# Patient Record
Sex: Male | Born: 1985
Health system: Southern US, Community
[De-identification: ages and names within clinical notes are randomized; demographics above are authoritative.]

## PROBLEM LIST (undated history)

## (undated) DIAGNOSIS — T7840XA Allergy, unspecified, initial encounter: Secondary | ICD-10-CM

## (undated) DIAGNOSIS — T7808XA Anaphylactic reaction due to eggs, initial encounter: Secondary | ICD-10-CM

## (undated) DIAGNOSIS — J45909 Unspecified asthma, uncomplicated: Secondary | ICD-10-CM

## (undated) DIAGNOSIS — L301 Dyshidrosis [pompholyx]: Secondary | ICD-10-CM

## (undated) HISTORY — PX: MOLE REMOVAL: SHX2046

## (undated) HISTORY — DX: Allergy, unspecified, initial encounter: T78.40XA

## (undated) HISTORY — DX: Dyshidrosis (pompholyx): L30.1

## (undated) HISTORY — PX: WISDOM TOOTH EXTRACTION: SHX21

## (undated) HISTORY — DX: Unspecified asthma, uncomplicated: J45.909

## (undated) HISTORY — DX: Anaphylactic reaction due to eggs, initial encounter: T78.08XA

---

## 2014-03-02 ENCOUNTER — Encounter: Payer: Self-pay | Admitting: Family Medicine

## 2014-03-02 ENCOUNTER — Ambulatory Visit (INDEPENDENT_AMBULATORY_CARE_PROVIDER_SITE_OTHER): Payer: BLUE CROSS/BLUE SHIELD | Admitting: Family Medicine

## 2014-03-02 VITALS — BP 128/85 | HR 78 | Ht 72.0 in | Wt 205.0 lb

## 2014-03-02 DIAGNOSIS — Z0000 Encounter for general adult medical examination without abnormal findings: Secondary | ICD-10-CM

## 2014-03-02 NOTE — Patient Instructions (Signed)
Dr. Kynzlee Hucker's General Advice Following Your Complete Physical Exam  The Benefits of Regular Exercise: Unless you suffer from an uncontrolled cardiovascular condition, studies strongly suggest that regular exercise and physical activity will add to both the quality and length of your life.  The World Health Organization recommends 150 minutes of moderate intensity aerobic activity every week.  This is best split over 3-4 days a week, and can be as simple as a brisk walk for just over 35 minutes "most days of the week".  This type of exercise has been shown to lower LDL-Cholesterol, lower average blood sugars, lower blood pressure, lower cardiovascular disease risk, improve memory, and increase one's overall sense of wellbeing.  The addition of anaerobic (or "strength training") exercises offers additional benefits including but not limited to increased metabolism, prevention of osteoporosis, and improved overall cholesterol levels.  How Can I Strive For A Low-Fat Diet?: Current guidelines recommend that 25-35 percent of your daily energy (food) intake should come from fats.  One might ask how can this be achieved without having to dissect each meal on a daily basis?  Switch to skim or 1% milk instead of whole milk.  Focus on lean meats such as ground turkey, fresh fish, baked chicken, and lean cuts of beef as your source of dietary protein.  Limit saturated fat consumption to less than 10% of your daily caloric intake.  Limit trans fatty acid consumption primarily by limiting synthetic trans fats such as partially hydrogenated oils (Ex: fried fast foods).  Substitute olive or vegetable oil for solid fats where possible.  Moderation of Salt Intake: Provided you don't carry a diagnosis of congestive heart failure nor renal failure, I recommend a daily allowance of no more than 2300 mg of salt (sodium).  Keeping under this daily goal is associated with a decreased risk of cardiovascular events, creeping  above it can lead to elevated blood pressures and increases your risk of cardiovascular events.  Milligrams (mg) of salt is listed on all nutrition labels, and your daily intake can add up faster than you think.  Most canned and frozen dinners can pack in over half your daily salt allowance in one meal.    Lifestyle Health Risks: Certain lifestyle choices carry specific health risks.  As you may already know, tobacco use has been associated with increasing one's risk of cardiovascular disease, pulmonary disease, numerous cancers, among many other issues.  What you may not know is that there are medications and nicotine replacement strategies that can more than double your chances of successfully quitting.  I would be thrilled to help manage your quitting strategy if you currently use tobacco products.  When it comes to alcohol use, I've yet to find an "ideal" daily allowance.  Provided an individual does not have a medical condition that is exacerbated by alcohol consumption, general guidelines determine "safe drinking" as no more than two standard drinks for a man or no more than one standard drink for a male per day.  However, much debate still exists on whether any amount of alcohol consumption is technically "safe".  My general advice, keep alcohol consumption to a minimum for general health promotion.  If you or others believe that alcohol, tobacco, or recreational drug use is interfering with your life, I would be happy to provide confidential counseling regarding treatment options.  General "Over The Counter" Nutrition Advice: Postmenopausal women should aim for a daily calcium intake of 1200 mg, however a significant portion of this might already be   provided by diets including milk, yogurt, cheese, and other dairy products.  Vitamin D has been shown to help preserve bone density, prevent fatigue, and has even been shown to help reduce falls in the elderly.  Ensuring a daily intake of 800 Units of  Vitamin D is a good place to start to enjoy the above benefits, we can easily check your Vitamin D level to see if you'd potentially benefit from supplementation beyond 800 Units a day.  Folic Acid intake should be of particular concern to women of childbearing age.  Daily consumption of 400-800 mcg of Folic Acid is recommended to minimize the chance of spinal cord defects in a fetus should pregnancy occur.    For many adults, accidents still remain one of the most common culprits when it comes to cause of death.  Some of the simplest but most effective preventitive habits you can adopt include regular seatbelt use, proper helmet use, securing firearms, and regularly testing your smoke and carbon monoxide detectors.  Ricardo Koltz B. Ayde Record DO [facility] [address], [address] [place], [place] [address] Phone: [phone]  

## 2014-03-02 NOTE — Progress Notes (Signed)
CC: Ricardo Young is a 29 y.o. 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 He sees an allergist about once a year, Dr. Vista [facility]   Colonoscopy: No family history of colon cancer will begin screening at age 37 Prostate: Discussed screening risks/beneifts with patient today, will consider screening at age 13.   Influenza Vaccine: allergic to this Pneumovax:  Td/Tdap: He believes he's had this in the last 10 years, requesting outside records. Zoster: (Start 29 yo)  Rare alcohol use no tobacco or recreational drug use  Review of Systems - General ROS: negative for - chills, fever, night sweats, weight gain or weight loss Ophthalmic ROS: negative for - decreased vision Psychological ROS: negative for - anxiety or depression ENT ROS: negative for - hearing change, nasal congestion, tinnitus or allergies Hematological and Lymphatic ROS: negative for - bleeding problems, bruising or swollen lymph nodes Breast ROS: negative Respiratory ROS: no cough, shortness of breath, or wheezing Cardiovascular ROS: no chest pain or dyspnea on exertion Gastrointestinal ROS: no abdominal pain, change in bowel habits, or black or bloody stools Genito-Urinary ROS: negative for - genital discharge, genital ulcers, incontinence or abnormal bleeding from genitals Musculoskeletal ROS: negative for - joint pain or muscle pain Neurological ROS: negative for - headaches or memory loss Dermatological ROS: negative for lumps, mole changes, rash and skin lesion changes    Past Medical History  Diagnosis Date  . Allergy     Past Surgical History  Procedure Laterality Date  . Wisdom tooth extraction    . Mole removal     Family History  Problem Relation Age of Onset  . Stroke Maternal  Grandmother   . Stroke Maternal Grandfather   . Diabetes Maternal Grandmother   . Diabetes Maternal Grandfather     History   Social History  . Marital Status: Married    Spouse Name: N/A    Number of Children: N/A  . Years of Education: N/A   Occupational History  . Not on file.   Social History Main Topics  . Smoking status: Never Smoker   . Smokeless tobacco: Not on file  . Alcohol Use: No  . Drug Use: No  . Sexual Activity:    Partners: Female   Other Topics Concern  . Not on file   Social History Narrative     Objective: BP 128/85 mmHg  Pulse 78  Ht 6' (1.829 m)  Wt 205 lb (92.987 kg)  BMI 27.80 kg/m2  General: No Acute Distress HEENT: Atraumatic, normocephalic, conjunctivae normal without scleral icterus.  No nasal discharge, hearing grossly intact, TMs with good landmarks bilaterally with no middle ear abnormalities, posterior pharynx clear without oral lesions. Neck: Supple, trachea midline, no cervical nor supraclavicular adenopathy. Pulmonary: Clear to auscultation bilaterally without wheezing, rhonchi, nor rales. Cardiac: Regular rate and rhythm.  No murmurs, rubs, nor gallops. No peripheral edema.  2+ peripheral pulses bilaterally. Abdomen: Bowel sounds normal.  No masses.  Non-tender without rebound.  Negative Murphy's sign. MSK: Grossly intact, no signs of weakness.  Full strength throughout upper and lower extremities.  Full ROM in upper and lower extremities.  No midline spinal tenderness. Neuro: Gait unremarkable, CN II-XII grossly intact.  C5-C6 Reflex 2/4 Bilaterally, L4 Reflex 2/4 Bilaterally.  Cerebellar function intact. Skin: No rashes.  Psych: Alert and oriented to person/place/time.  Thought process normal. No anxiety/depression.  Assessment & Plan: Lorin PicketScott was seen today for establish care.  Diagnoses and associated orders for this visit:  Annual physical exam - Lipid panel - COMPLETE METABOLIC PANEL WITH GFR    Healthy lifestyle  interventions including but not limited to regular exercise, a healthy low fat diet, moderation of salt intake, the dangers of tobacco/alcohol/recreational drug use, nutrition supplementation, and accident avoidance were discussed with the patient and a handout was provided for future reference.  Forms for [employer] completed.  Return if symptoms worsen or fail to improve.

## 2014-03-26 ENCOUNTER — Encounter: Payer: Self-pay | Admitting: Family Medicine

## 2014-03-26 DIAGNOSIS — T7840XA Allergy, unspecified, initial encounter: Secondary | ICD-10-CM | POA: Insufficient documentation

## 2014-03-26 DIAGNOSIS — J45909 Unspecified asthma, uncomplicated: Secondary | ICD-10-CM | POA: Insufficient documentation

## 2014-03-26 HISTORY — DX: Unspecified asthma, uncomplicated: J45.909

## 2014-04-28 ENCOUNTER — Encounter: Payer: Self-pay | Admitting: Family Medicine

## 2014-05-22 LAB — COMPLETE METABOLIC PANEL WITH GFR
ALK PHOS: 66 U/L (ref 39–117)
ALT: 41 U/L (ref 0–53)
AST: 21 U/L (ref 0–37)
Albumin: 4.4 g/dL (ref 3.5–5.2)
BUN: 14 mg/dL (ref 6–23)
CO2: 27 mEq/L (ref 19–32)
Calcium: 9.3 mg/dL (ref 8.4–10.5)
Chloride: 101 mEq/L (ref 96–112)
Creat: 1.01 mg/dL (ref 0.50–1.35)
GFR, Est African American: >89 mL/min
GLUCOSE: 84 mg/dL (ref 70–99)
Potassium: 4.5 mEq/L (ref 3.5–5.3)
Sodium: 140 mEq/L (ref 135–145)
Total Bilirubin: 0.7 mg/dL (ref 0.2–1.2)
Total Protein: 7.2 g/dL (ref 6.0–8.3)

## 2014-05-22 LAB — LIPID PANEL
CHOLESTEROL: 173 mg/dL (ref 0–200)
HDL: 30 mg/dL — ABNORMAL LOW (ref >=40)
LDL CALC: 96 mg/dL (ref 0–99)
Total CHOL/HDL Ratio: 5.8 Ratio
Triglycerides: 234 mg/dL — ABNORMAL HIGH (ref <150)
VLDL: 47 mg/dL — AB (ref 0–40)

## 2014-05-25 ENCOUNTER — Telehealth: Payer: Self-pay | Admitting: Family Medicine

## 2014-05-25 ENCOUNTER — Encounter: Payer: Self-pay | Admitting: Family Medicine

## 2014-05-25 DIAGNOSIS — E781 Pure hyperglyceridemia: Secondary | ICD-10-CM | POA: Insufficient documentation

## 2014-05-25 MED ORDER — FISH OIL 1000 MG PO CAPS: ORAL_CAPSULE | ORAL | Status: DC

## 2014-05-25 NOTE — Telephone Encounter (Signed)
Ricardo Young, Will you please let patient know that his blood sugar, kidney function, and liver function were all normal.  His cholesterol is normal however triglyerides are elevated to a degree that over time can lead to pancreatic or liver inflammation. I'd recommend he start a 1g OTC fish oil supplement twice a day with meals and return for a recheck in three months.

## 2014-05-25 NOTE — Telephone Encounter (Signed)
Pt.notified

## 2014-10-26 ENCOUNTER — Encounter: Payer: Self-pay | Admitting: Family Medicine

## 2014-10-26 ENCOUNTER — Ambulatory Visit (INDEPENDENT_AMBULATORY_CARE_PROVIDER_SITE_OTHER): Payer: BLUE CROSS/BLUE SHIELD | Admitting: Family Medicine

## 2014-10-26 VITALS — BP 131/88 | HR 78 | Wt 194.0 lb

## 2014-10-26 DIAGNOSIS — L0291 Cutaneous abscess, unspecified: Secondary | ICD-10-CM | POA: Diagnosis not present

## 2014-10-26 MED ORDER — CLINDAMYCIN HCL 300 MG PO CAPS: 300.0000 mg | ORAL_CAPSULE | Freq: Three times a day (TID) | ORAL | Status: DC

## 2014-10-26 NOTE — Progress Notes (Signed)
CC: Ricardo Young is a 29 y.o. male is here for blister on heel and Joint Swelling   Subjective: HPI:  On Saturday he started to form a blister on the back of the left heel. The blister has slowly been enlarging and has become more and more painful. On Sunday he also noticed that the inside of his lower medial ankle is becoming swollen and painful. Pain is worse when he takes his first few steps after inactivity and then slowly improves. Currently mild in severity. Denies any other trauma to the left ankle. Interventions have included Epsom salts soaking, elevation of the ankle, and ice. These interventions only been mildly beneficial. He states he feels well other than the pain. Denies fevers, chills, shortness of breath, nor joint pain elsewhere.   Review Of Systems Outlined In HPI  Past Medical History  Diagnosis Date  . Allergy     Past Surgical History  Procedure Laterality Date  . Wisdom tooth extraction    . Mole removal     Family History  Problem Relation Age of Onset  . Stroke Maternal Grandmother   . Stroke Maternal Grandfather   . Diabetes Maternal Grandmother   . Diabetes Maternal Grandfather     Social History   Social History  . Marital Status: Married    Spouse Name: N/A  . Number of Children: N/A  . Years of Education: N/A   Occupational History  . Not on file.   Social History Main Topics  . Smoking status: Never Smoker   . Smokeless tobacco: Not on file  . Alcohol Use: No  . Drug Use: No  . Sexual Activity:    Partners: Female   Other Topics Concern  . Not on file   Social History Narrative     Objective: BP 131/88 mmHg  Pulse 78  Wt 194 lb (87.998 kg)  Vital signs reviewed. General: Alert and Oriented, No Acute Distress HEENT: Pupils equal, round, reactive to light. Conjunctivae clear.  External ears unremarkable.  Moist mucous membranes. Lungs: Clear and comfortable work of breathing, speaking in full sentences without accessory muscle  use. Cardiac: Regular rate and rhythm.  Neuro: CN II-XII grossly intact, gait normal. Extremities: No peripheral edema.  Strong peripheral pulses. Slight pain at the inferior aspect of the left medial malleolus.  Mental Status: No depression, anxiety, nor agitation. Logical though process. Skin: Warm and dry. On the back of the left ankle there is a 2 cm diameter blister filled with purulent material. There is some mild erythema surrounding this with moderate swelling underneath the medial malleolus.  Assessment & Plan: Ricardo Young was seen today for blister on heel and joint swelling.  Diagnoses and all orders for this visit:  Abscess -     clindamycin (CLEOCIN) 300 MG capsule; Take 1 capsule (300 mg total) by mouth 3 (three) times daily. -     Wound culture   His blister has now turned into an abscess. He will start on clindamycin and continue with Epsom salts soaks twice a day. I sent some of the abscess fluid culture, I've asked him to call me if he is not feeling or looking better by Wednesday.Signs and symptoms requring emergent/urgent reevaluation were discussed with the patient.  Return if symptoms worsen or fail to improve.   Incision and Drainage Procedure Note  Pre-operative Diagnosis: abscess  Post-operative Diagnosis: same  Indications: pain and infection  Anesthesia: not needed  Procedure Details  The procedure, risks and complications have been  discussed in detail (including, but not limited to airway compromise, infection, bleeding) with the patient, and the patient has signed consent to the procedure.  The skin was sterilely prepped and draped over the affected area in the usual fashion. After adequate local anesthesia, I&D with a #11 and 10 blade was performed on the left back of ankle. Purulent drainage: present The patient was observed until stable.  Findings: Successful I7D  EBL: 0 cc's  Drains: none  Condition: Tolerated procedure  well   Complications: none.

## 2014-10-30 ENCOUNTER — Ambulatory Visit (INDEPENDENT_AMBULATORY_CARE_PROVIDER_SITE_OTHER): Payer: BLUE CROSS/BLUE SHIELD | Admitting: Family Medicine

## 2014-10-30 ENCOUNTER — Ambulatory Visit (INDEPENDENT_AMBULATORY_CARE_PROVIDER_SITE_OTHER): Payer: BLUE CROSS/BLUE SHIELD

## 2014-10-30 ENCOUNTER — Encounter: Payer: Self-pay | Admitting: Family Medicine

## 2014-10-30 VITALS — BP 144/96 | HR 82 | Ht 72.0 in | Wt 192.0 lb

## 2014-10-30 DIAGNOSIS — X58XXXA Exposure to other specified factors, initial encounter: Secondary | ICD-10-CM | POA: Diagnosis not present

## 2014-10-30 DIAGNOSIS — S92411A Displaced fracture of proximal phalanx of right great toe, initial encounter for closed fracture: Secondary | ICD-10-CM

## 2014-10-30 DIAGNOSIS — S92911A Unspecified fracture of right toe(s), initial encounter for closed fracture: Secondary | ICD-10-CM | POA: Diagnosis not present

## 2014-10-30 DIAGNOSIS — S9031XA Contusion of right foot, initial encounter: Secondary | ICD-10-CM

## 2014-10-30 LAB — WOUND CULTURE: Gram Stain: NONE SEEN

## 2014-10-30 NOTE — Assessment & Plan Note (Signed)
Plan to treat with postop shoe and limited weightbearing. Return in one to 2 weeks. NSAIDs for pain control.

## 2014-10-30 NOTE — Patient Instructions (Signed)
Thank you for coming in today. You broke your toe.  Use the post op shoe until you come back.  Return in 1 week or follow up with workers comp doctor.  Take ibuprofen for pain as needed.   Toe Fracture Your caregiver has diagnosed you as having a fractured toe. A toe fracture is a break in the bone of a toe. "Buddy taping" is a way of splinting your broken toe, by taping the broken toe to the toe next to it. This "buddy taping" will keep the injured toe from moving beyond normal range of motion. Buddy taping also helps the toe heal in a more normal alignment. It may take 6 to 8 weeks for the toe injury to heal. HOME CARE INSTRUCTIONS   Leave your toes taped together for as long as directed by your caregiver or until you see a doctor for a follow-up examination. You can change the tape after bathing. Always use a small piece of gauze or cotton between the toes when taping them together. This will help the skin stay dry and prevent infection.  Apply ice to the injury for 15-20 minutes each hour while awake for the first 2 days. Put the ice in a plastic bag and place a towel between the bag of ice and your skin.  After the first 2 days, apply heat to the injured area. Use heat for the next 2 to 3 days. Place a heating pad on the foot or soak the foot in warm water as directed by your caregiver.  Keep your foot elevated as much as possible to lessen swelling.  Wear sturdy, supportive shoes. The shoes should not pinch the toes or fit tightly against the toes.  Your caregiver may prescribe a rigid shoe if your foot is very swollen.  Your may be given crutches if the pain is too great and it hurts too much to walk.  Only take over-the-counter or prescription medicines for pain, discomfort, or fever as directed by your caregiver.  If your caregiver has given you a follow-up appointment, it is very important to keep that appointment. Not keeping the appointment could result in a chronic or permanent  injury, pain, and disability. If there is any problem keeping the appointment, you must call back to this facility for assistance. SEEK MEDICAL CARE IF:   You have increased pain or swelling, not relieved with medications.  The pain does not get better after 1 week.  Your injured toe is cold when the others are warm. SEEK IMMEDIATE MEDICAL CARE IF:   The toe becomes cold, numb, or white.  The toe becomes hot (inflamed) and red. Document Released: 01/28/2000 Document Revised: 04/24/2011 Document Reviewed: 09/16/2007 [facility] Patient Information 2015 [place], [place]. This information is not intended to replace advice given to you by your health care provider. Make sure you discuss any questions you have with your health care provider.

## 2014-10-30 NOTE — Progress Notes (Signed)
Ricardo Young is a 29 y.o. male who presents to [facility]  today for foot injury. Patient was at work yesterday when he dropped a heavy weight onto the dorsal aspect of his right first MTP. He notes pain and swelling. He's been using a crutch and taking ibuprofen which helps some. He has not had file Worker's Compensation claim. Heis able to move his toe and denies any weakness or numbness distally. No fevers chills nausea vomiting or diarrhea.   Past Medical History  Diagnosis Date  . Allergy    Past Surgical History  Procedure Laterality Date  . Wisdom tooth extraction    . Mole removal     Social History  Substance Use Topics  . Smoking status: Never Smoker   . Smokeless tobacco: Not on file  . Alcohol Use: No   family history includes Diabetes in his maternal grandfather and maternal grandmother; Stroke in his maternal grandfather and maternal grandmother.  ROS as above Medications: Current Outpatient Prescriptions  Medication Sig Dispense Refill  . clindamycin (CLEOCIN) 300 MG capsule Take 1 capsule (300 mg total) by mouth 3 (three) times daily. 30 capsule 0  . diphenhydrAMINE (BENADRYL) 25 mg capsule Take 25 mg by mouth every 6 (six) hours as needed.    . Omega-3 Fatty Acids (FISH OIL) 1000 MG CAPS One PO BID with meals.  0   No current facility-administered medications for this visit.   Allergies  Allergen Reactions  . Augmentin [Amoxicillin-Pot Clavulanate] Anaphylaxis  . Eggs Or Egg-Derived Products Anaphylaxis  . Influenza Vaccines Anaphylaxis  . Amoxicillin      Exam:  BP 144/96 mmHg  Pulse 82  Ht 6' (1.829 m)  Wt 192 lb (87.091 kg)  BMI 26.03 kg/m2 Gen: Well NAD HEENT: EOMI,  MMM Lungs: Normal work of breathing. CTABL Heart: RRR no MRG Abd: NABS, Soft. Nondistended, Nontender Exts: Brisk capillary refill, warm and well perfused.  Right foot: Swollen ecchymosis and tender just proximal to the first MTP of the right  foot.   No results found for this or any previous visit (from the past 24 hour(s)). Dg Foot Complete Right  10/30/2014   CLINICAL DATA:  Pain, swelling and bruising to anterior right foot over second through fourth metatarsals after dropping heavy weight on it last night.  EXAM: RIGHT FOOT COMPLETE - 3+ VIEW  COMPARISON:  None.  FINDINGS: There is a comminuted intraarticular fracture in the distal aspect of the right great toe proximal phalanx. Minimal displacement of fracture fragments. Joint spaces are maintained.  IMPRESSION: Comminuted, intra-articular fracture through the distal aspect of the right great toe proximal phalanx.   Electronically Signed   By: Charlett Nose M.D.   On: 10/30/2014 09:36     Please see individual assessment and plan sections.

## 2015-04-14 ENCOUNTER — Encounter: Payer: Self-pay | Admitting: Family Medicine

## 2015-04-14 DIAGNOSIS — L309 Dermatitis, unspecified: Secondary | ICD-10-CM | POA: Insufficient documentation

## 2015-04-15 ENCOUNTER — Encounter: Payer: Self-pay | Admitting: Family Medicine

## 2015-06-09 ENCOUNTER — Ambulatory Visit (INDEPENDENT_AMBULATORY_CARE_PROVIDER_SITE_OTHER): Payer: BLUE CROSS/BLUE SHIELD | Admitting: Family Medicine

## 2015-06-09 ENCOUNTER — Encounter: Payer: Self-pay | Admitting: Family Medicine

## 2015-06-09 VITALS — BP 134/93 | HR 76 | Wt 206.0 lb

## 2015-06-09 DIAGNOSIS — T7808XA Anaphylactic reaction due to eggs, initial encounter: Secondary | ICD-10-CM

## 2015-06-09 MED ORDER — EPINEPHRINE 0.15 MG/0.15ML IJ SOAJ: 0.1500 mg | INTRAMUSCULAR | Status: DC | PRN

## 2015-06-09 NOTE — Progress Notes (Signed)
CC: Spero GeraldsScott Mauceri is a 30 y.o. male is here for Allergies   Subjective: HPI:  History of anaphylaxis to egg products reports a history of anaphylaxis to egg products, it required hospitalizations twice. He would like to know if it would be wise for him to carry a epinephrine injector. He's been pretty good over the last few years of avoiding eggs products. He tells me he has some sniffles due to environmental allergens but overall has been feeling good lately. He'll be traveling out of the country later this year and thinks this might require a epinephrine injector. Currently he denies any flushing, shortness of breath, difficulty swallowing or difficulty breathing.   Review Of Systems Outlined In HPI  Past Medical History  Diagnosis Date  . Allergy     Past Surgical History  Procedure Laterality Date  . Wisdom tooth extraction    . Mole removal     Family History  Problem Relation Age of Onset  . Stroke Maternal Grandmother   . Stroke Maternal Grandfather   . Diabetes Maternal Grandmother   . Diabetes Maternal Grandfather     Social History   Social History  . Marital Status: Married    Spouse Name: N/A  . Number of Children: N/A  . Years of Education: N/A   Occupational History  . Not on file.   Social History Main Topics  . Smoking status: Never Smoker   . Smokeless tobacco: Not on file  . Alcohol Use: No  . Drug Use: No  . Sexual Activity:    Partners: Female   Other Topics Concern  . Not on file   Social History Narrative     Objective: BP 134/93 mmHg  Pulse 76  Wt 206 lb (93.441 kg)  Vital signs reviewed. General: Alert and Oriented, No Acute Distress HEENT: Pupils equal, round, reactive to light. Conjunctivae clear.  External ears unremarkable.  Moist mucous membranes. Lungs: Clear and comfortable work of breathing, speaking in full sentences without accessory muscle use. Cardiac: Regular rate and rhythm.  Neuro: CN II-XII grossly intact, gait  normal. Extremities: No peripheral edema.  Strong peripheral pulses.  Mental Status: No depression, anxiety, nor agitation. Logical though process. Skin: Warm and dry. Assessment & Plan: Lorin PicketScott was seen today for allergies.  Diagnoses and all orders for this visit:  Anaphylaxis due to eggs, initial encounter -     EPINEPHrine 0.15 MG/0.15ML IJ injection; Inject 0.15 mLs (0.15 mg total) into the muscle as needed for anaphylaxis.   Discussed that it would be very reasonable for him to keep a epinephrine injector on hand whether his here in the states or internationally traveling. We discussed how to use this device and what situation it would need to be used in.  Return if symptoms worsen or fail to improve.

## 2016-03-17 DIAGNOSIS — J111 Influenza due to unidentified influenza virus with other respiratory manifestations: Secondary | ICD-10-CM | POA: Diagnosis not present

## 2016-05-28 ENCOUNTER — Emergency Department (INDEPENDENT_AMBULATORY_CARE_PROVIDER_SITE_OTHER)
Admission: EM | Admit: 2016-05-28 | Discharge: 2016-05-28 | Disposition: A | Discharge status: Home / Self Care | Payer: BLUE CROSS/BLUE SHIELD | Source: Home / Self Care | Attending: Family Medicine | Admitting: Family Medicine

## 2016-05-28 ENCOUNTER — Encounter: Payer: Self-pay | Admitting: Emergency Medicine

## 2016-05-28 DIAGNOSIS — J069 Acute upper respiratory infection, unspecified: Secondary | ICD-10-CM | POA: Diagnosis not present

## 2016-05-28 DIAGNOSIS — B9789 Other viral agents as the cause of diseases classified elsewhere: Secondary | ICD-10-CM

## 2016-05-28 LAB — POCT INFLUENZA A/B
INFLUENZA B, POC: NEGATIVE
Influenza A, POC: NEGATIVE

## 2016-05-28 MED ORDER — PREDNISONE 20 MG PO TABS: ORAL_TABLET | ORAL | 0 refills | Status: DC

## 2016-05-28 MED ORDER — AZITHROMYCIN 250 MG PO TABS: ORAL_TABLET | ORAL | 0 refills | Status: DC

## 2016-05-28 NOTE — ED Triage Notes (Signed)
Patient has seasonal allergies but 2 days ago he experienced increased congestion and need to clear his throat; had low grade fever of 100. No OTCs today. Is not taking his sinus allergy med.

## 2016-05-28 NOTE — Discharge Instructions (Signed)
Take plain guaifenesin (1200mg extended release tabs such as Mucinex) twice daily, with plenty of water, for cough and congestion.  May add Pseudoephedrine (30mg, one or two every 4 to 6 hours) for sinus congestion.  Get adequate rest.   °May use Afrin nasal spray (or generic oxymetazoline) each morning for about 5 days and then discontinue.  Also recommend using saline nasal spray several times daily and saline nasal irrigation (AYR is a common brand).   °Try warm salt water gargles for sore throat.  °Stop all antihistamines for now, and other non-prescription cough/cold preparations. °May take Delsym Cough Suppressant at bedtime for nighttime cough.  °Begin Azithromycin if not improving about one week or if persistent fever develops   °Follow-up with family doctor if not improving about10 days.  °

## 2016-05-28 NOTE — ED Provider Notes (Signed)
[facility]    CSN: [id number] Arrival date & time: 05/28/16  1319     History   Chief Complaint Chief Complaint  Patient presents with  . Nasal Congestion  . Cough    HPI Ricardo Young is a 31 y.o. male.   Complains of 2 day history flu-like illness including myalgias, headache, fever (to 100.2)/chills, fatigue, and cough.  Also has mild nasal congestion and sore throat.  Cough is non-productive and somewhat worse at night.  No pleuritic pain or shortness of breath but he had some wheezing this morning.  He had influenza A three months ago.   The history is provided by the patient.    Past Medical History:  Diagnosis Date  . Allergy     Patient Active Problem List   Diagnosis Date Noted  . Eczema 04/14/2015  . Toe fracture, right 10/30/2014  . Hypertriglyceridemia 05/25/2014  . Allergic reaction 03/26/2014  . Asthma, chronic 03/26/2014    Past Surgical History:  Procedure Laterality Date  . MOLE REMOVAL    . WISDOM TOOTH EXTRACTION         Home Medications    Prior to Admission medications   Medication Sig Start Date End Date Taking? Authorizing Provider  azithromycin (ZITHROMAX Z-PAK) 250 MG tablet Take 2 tabs today; then begin one tab once daily for 4 more days. (Rx void after 06/05/16) 05/28/16   Lattie Haw, MD  diphenhydrAMINE (BENADRYL) 25 mg capsule Take 25 mg by mouth every 6 (six) hours as needed.    Historical Provider, MD  EPINEPHrine 0.15 MG/0.15ML IJ injection Inject 0.15 mLs (0.15 mg total) into the muscle as needed for anaphylaxis. 06/09/15   Laren Boom, DO  Omega-3 Fatty Acids (FISH OIL) 1000 MG CAPS One PO BID with meals. 05/25/14   Sean Hommel, DO  predniSONE (DELTASONE) 20 MG tablet Take one tab by mouth twice daily for 4 days, then one daily. Take with food. 05/28/16   Lattie Haw, MD    Family History Family History  Problem Relation Age of Onset  . Stroke Maternal Grandmother   . Diabetes Maternal Grandmother   .  Stroke Maternal Grandfather   . Diabetes Maternal Grandfather     Social History Social History  Substance Use Topics  . Smoking status: Never Smoker  . Smokeless tobacco: Never Used  . Alcohol use No     Allergies   Augmentin [amoxicillin-pot clavulanate]; Eggs or egg-derived products; Influenza vaccines; and Amoxicillin   Review of Systems Review of Systems + sore throat + cough No pleuritic pain + wheezing + nasal congestion + post-nasal drainage No sinus pain/pressure No itchy/red eyes No earache No hemoptysis No SOB + fever, + chills No nausea No vomiting No abdominal pain No diarrhea No urinary symptoms No skin rash + fatigue + myalgias + headache Used OTC meds without relief   Physical Exam Triage Vital Signs ED Triage Vitals  Enc Vitals Group     BP 05/28/16 1406 113/76     Pulse Rate 05/28/16 1406 92     Resp 05/28/16 1406 17     Temp 05/28/16 1406 98.5 F (36.9 C)     Temp Source 05/28/16 1406 Oral     SpO2 05/28/16 1406 98 %     Weight 05/28/16 1407 190 lb (86.2 kg)     Height 05/28/16 1407 6' (1.829 m)     Head Circumference --      Peak Flow --  Pain Score 05/28/16 1407 0     Pain Loc --      Pain Edu? --      Excl. in GC? --    No data found.   Updated Vital Signs BP 113/76 (BP Location: Left Arm)   Pulse 92   Temp 98.5 F (36.9 C) (Oral)   Resp 17   Ht 6' (1.829 m)   Wt 190 lb (86.2 kg)   SpO2 98%   BMI 25.77 kg/m   Visual Acuity Right Eye Distance:   Left Eye Distance:   Bilateral Distance:    Right Eye Near:   Left Eye Near:    Bilateral Near:     Physical Exam Nursing notes and Vital Signs reviewed. Appearance:  Patient appears stated age, and in no acute distress Eyes:  Pupils are equal, round, and reactive to light and accomodation.  Extraocular movement is intact.  Conjunctivae are not inflamed  Ears:  Canals normal.  Tympanic membranes normal.  Nose:  Mildly congested turbinates.  No sinus tenderness.      Pharynx:  Normal Neck:  Supple.  Tender enlarged posterior/lateral nodes are palpated bilaterally  Lungs:  Clear to auscultation.  Breath sounds are equal.  Moving air well. Heart:  Regular rate and rhythm without murmurs, rubs, or gallops.  Abdomen:  Nontender without masses or hepatosplenomegaly.  Bowel sounds are present.  No CVA or flank tenderness.  Extremities:  No edema.  Skin:  No rash present.    UC Treatments / Results  Labs (all labs ordered are listed, but only abnormal results are displayed) Labs Reviewed  POCT INFLUENZA A/B negative    EKG  EKG Interpretation None       Radiology No results found.  Procedures Procedures (including critical care time)  Medications Ordered in UC Medications - No data to display   Initial Impression / Assessment and Plan / UC Course  I have reviewed the triage vital signs and the nursing notes.  Pertinent labs & imaging results that were available during my care of the patient were reviewed by me and considered in my medical decision making (see chart for details).    There is no evidence of bacterial infection today.  With patient's history of seasonal rhinitis, and past history of asthma as a child, he will benefit from a course of prednisone.  Begin prednisone burst/taper. Take plain guaifenesin (  extended release tabs such as Mucinex) twice daily, with plenty of water, for cough and congestion.  May add Pseudoephedrine ( , one or two every 4 to 6 hours) for sinus congestion.  Get adequate rest.   May use Afrin nasal spray (or generic oxymetazoline) each morning for about 5 days and then discontinue.  Also recommend using saline nasal spray several times daily and saline nasal irrigation (AYR is a common brand).   Try warm salt water gargles for sore throat.  Stop all antihistamines for now, and other non-prescription cough/cold preparations. May take Delsym Cough Suppressant at bedtime for nighttime cough.    Begin Azithromycin if not improving about one week or if persistent fever develops (Given a prescription to hold, with an expiration date)  Follow-up with family doctor if not improving about10 days.     Final Clinical Impressions(s) / UC Diagnoses   Final diagnoses:  Viral URI with cough    New Prescriptions Discharge Medication List as of 05/28/2016  3:06 PM    START taking these medications   Details  azithromycin (ZITHROMAX Z-PAK)  250 MG tablet Take 2 tabs today; then begin one tab once daily for 4 more days. (Rx void after 06/05/16), Print    predniSONE (DELTASONE) 20 MG tablet Take one tab by mouth twice daily for 4 days, then one daily. Take with food., Normal         Lattie Haw, MD 06/01/16 [phone]

## 2016-11-10 ENCOUNTER — Ambulatory Visit (INDEPENDENT_AMBULATORY_CARE_PROVIDER_SITE_OTHER): Payer: BLUE CROSS/BLUE SHIELD | Admitting: Physician Assistant

## 2016-11-10 VITALS — BP 135/87 | HR 77 | Temp 98.1°F | Wt 195.0 lb

## 2016-11-10 DIAGNOSIS — L301 Dyshidrosis [pompholyx]: Secondary | ICD-10-CM

## 2016-11-10 MED ORDER — CLOBETASOL PROPIONATE 0.05 % EX OINT: 1.0000 "application " | TOPICAL_OINTMENT | Freq: Two times a day (BID) | CUTANEOUS | 0 refills | Status: DC

## 2016-11-10 MED ORDER — HYDROXYZINE HCL 25 MG PO TABS: 25.0000 mg | ORAL_TABLET | Freq: Three times a day (TID) | ORAL | 0 refills | Status: DC | PRN

## 2016-11-10 MED ORDER — MUPIROCIN 2 % EX OINT: TOPICAL_OINTMENT | CUTANEOUS | 0 refills | Status: DC

## 2016-11-10 NOTE — Progress Notes (Signed)
HPI:                                                                Ricardo Young is a 31 y.o. male who presents to [facility]: Primary Care [facility] Medicine today for rash  Patient with PMH of eczema, asthma and allergies presents today with worsening hand rash. Problem is chronic. States rash is confined to his fingers and is worse on his left 3rd digit, which is slightly tender. Reports itching and redness. Has been using topical Triamcinolone cream without much relief. Denies fever, drainage or swelling.   Past Medical History:  Diagnosis Date  . Allergy    Past Surgical History:  Procedure Laterality Date  . MOLE REMOVAL    . WISDOM TOOTH EXTRACTION     Social History  Substance Use Topics  . Smoking status: Never Smoker  . Smokeless tobacco: Never Used  . Alcohol use No   family history includes Diabetes in his maternal grandfather and maternal grandmother; Stroke in his maternal grandfather and maternal grandmother.  ROS: negative except as noted in the HPI  Medications: Current Outpatient Prescriptions  Medication Sig Dispense Refill  . azithromycin (ZITHROMAX Z-PAK) 250 MG tablet Take 2 tabs today; then begin one tab once daily for 4 more days. (Rx void after 06/05/16) 6 tablet 0  . diphenhydrAMINE (BENADRYL) 25 mg capsule Take 25 mg by mouth every 6 (six) hours as needed.    Marland Kitchen EPINEPHrine 0.15 MG/0.15ML IJ injection Inject 0.15 mLs (0.15 mg total) into the muscle as needed for anaphylaxis. 2 Device 0  . Omega-3 Fatty Acids (FISH OIL) 1000 MG CAPS One PO BID with meals.  0  . predniSONE (DELTASONE) 20 MG tablet Take one tab by mouth twice daily for 4 days, then one daily. Take with food. 12 tablet 0   No current facility-administered medications for this visit.    Allergies  Allergen Reactions  . Augmentin [Amoxicillin-Pot Clavulanate] Anaphylaxis  . Eggs Or Egg-Derived Products Anaphylaxis  . Influenza Vaccines Anaphylaxis  . Amoxicillin         Objective:  BP 135/87   Pulse 77   Temp 98.1 F (36.7 C) (Oral)   Wt 195 lb (88.5 kg)   BMI 26.45 kg/m  Gen:  alert, not ill-appearing, no distress, appropriate for age HEENT: head normocephalic without obvious abnormality, conjunctiva and cornea clear, trachea midline Pulm: Normal work of breathing, normal phonation, clear to auscultation bilaterally, no wheezes, rales or rhonchi CV: Normal rate, regular rhythm, s1 and s2 distinct, no murmurs, clicks or rubs  Neuro: alert and oriented x 3, no tremor MSK: extremities atraumatic, normal gait and station Skin: warm, dry, intact; dorsal aspect of bilateral digits with scaly, erythematous rash, left third distal phalanx slightly tender and red, no warmth or swelling     No results found for this or any previous visit (from the past 72 hour(s)). No results found.    Assessment and Plan: 31 y.o. male with   1. Dyshidrotic eczema - no evidence of paronychia. Super high potency topical corticosteroids to affected areas for 2 weeks. Treat secondary infection on left distal phalanx with Bactroban. - clobetasol ointment (TEMOVATE) 0.05 %; Apply 1 application topically 2 (two) times daily.  Dispense: 15 g;  Refill: 0 - mupirocin ointment (BACTROBAN) 2 %; Apply to affected area TID for 7 days.  Dispense: 30 g; Refill: 0 - hydrOXYzine (ATARAX/VISTARIL) 25 MG tablet; Take 1 tablet (25 mg total) by mouth 3 (three) times daily as needed for itching. For itching.  Dispense: 30 tablet; Refill: 0   Patient education and anticipatory guidance given Patient agrees with treatment plan Follow-up as needed if symptoms worsen or fail to improve  Levonne Hubert PA-C

## 2016-11-12 IMAGING — CR DG FOOT COMPLETE 3+V*R*
3 series · 3 of 3 positions shown · non-contrast
Comparison: None.

CLINICAL DATA: Pain, swelling and bruising to anterior right foot
over second through fourth metatarsals after dropping heavy weight
on it last night.

EXAM:
RIGHT FOOT COMPLETE - 3+ VIEW

[foot ap]
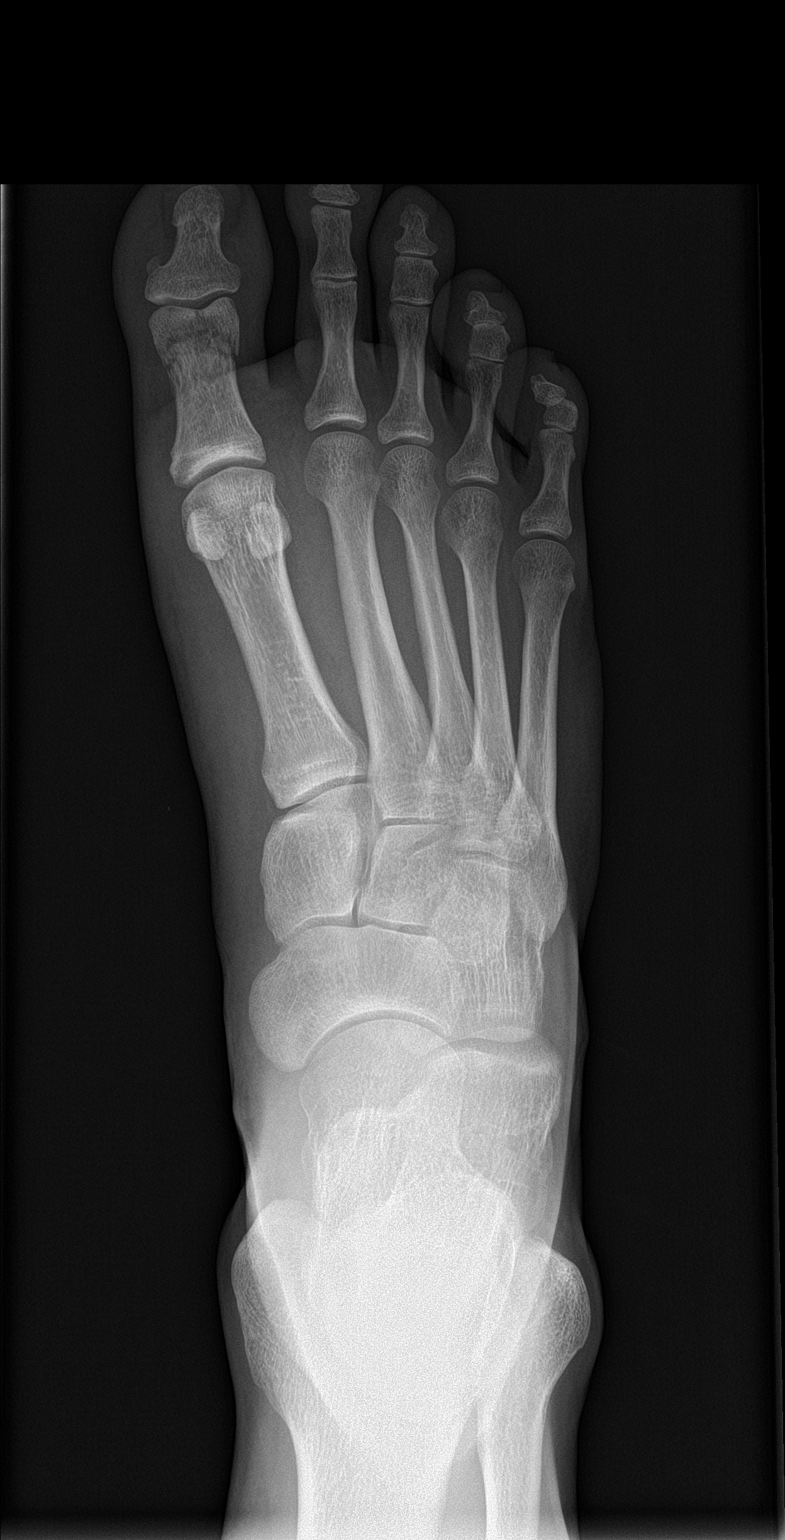

[foot obl]
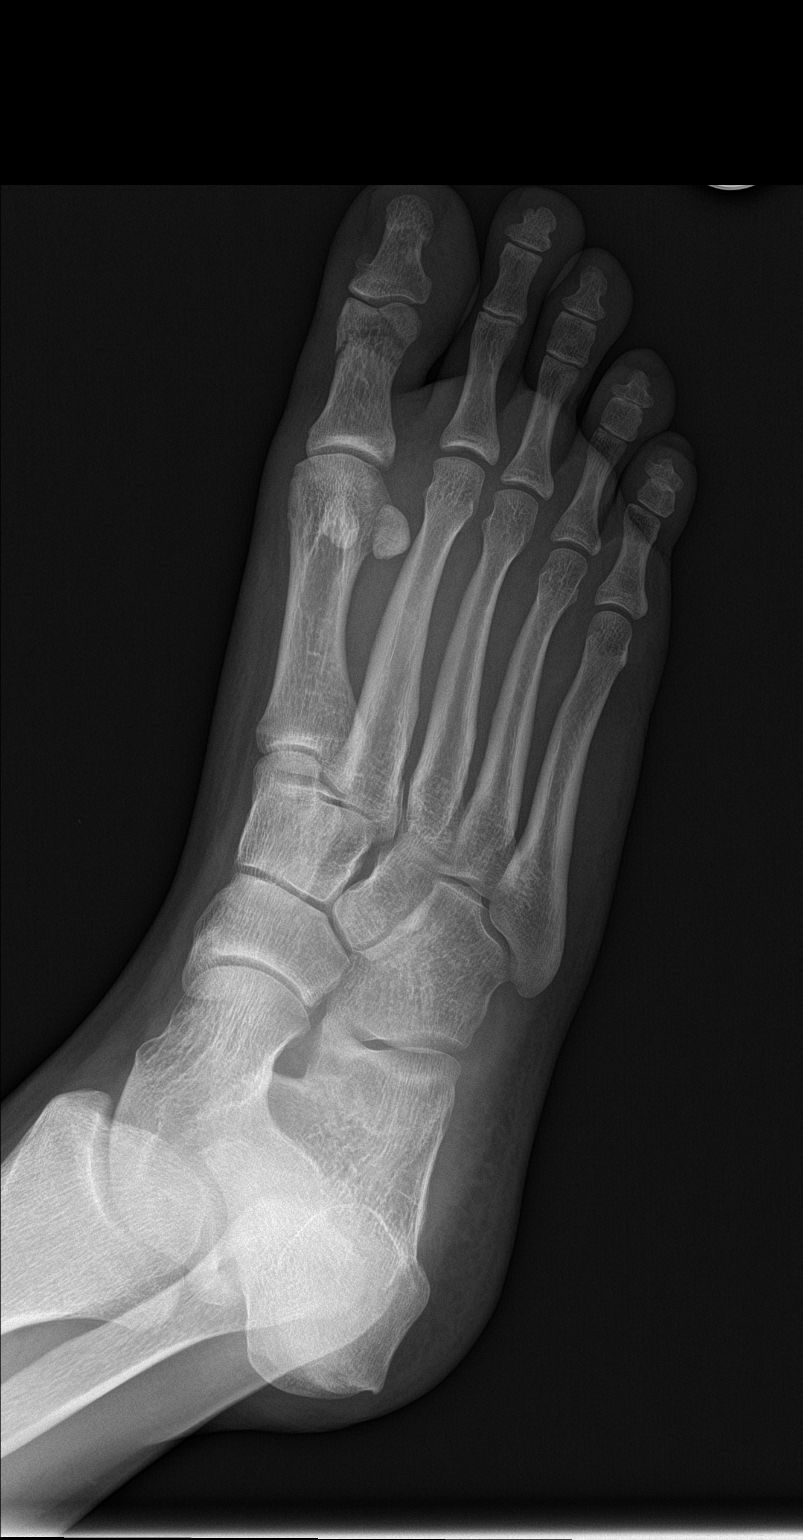

[foot lat]
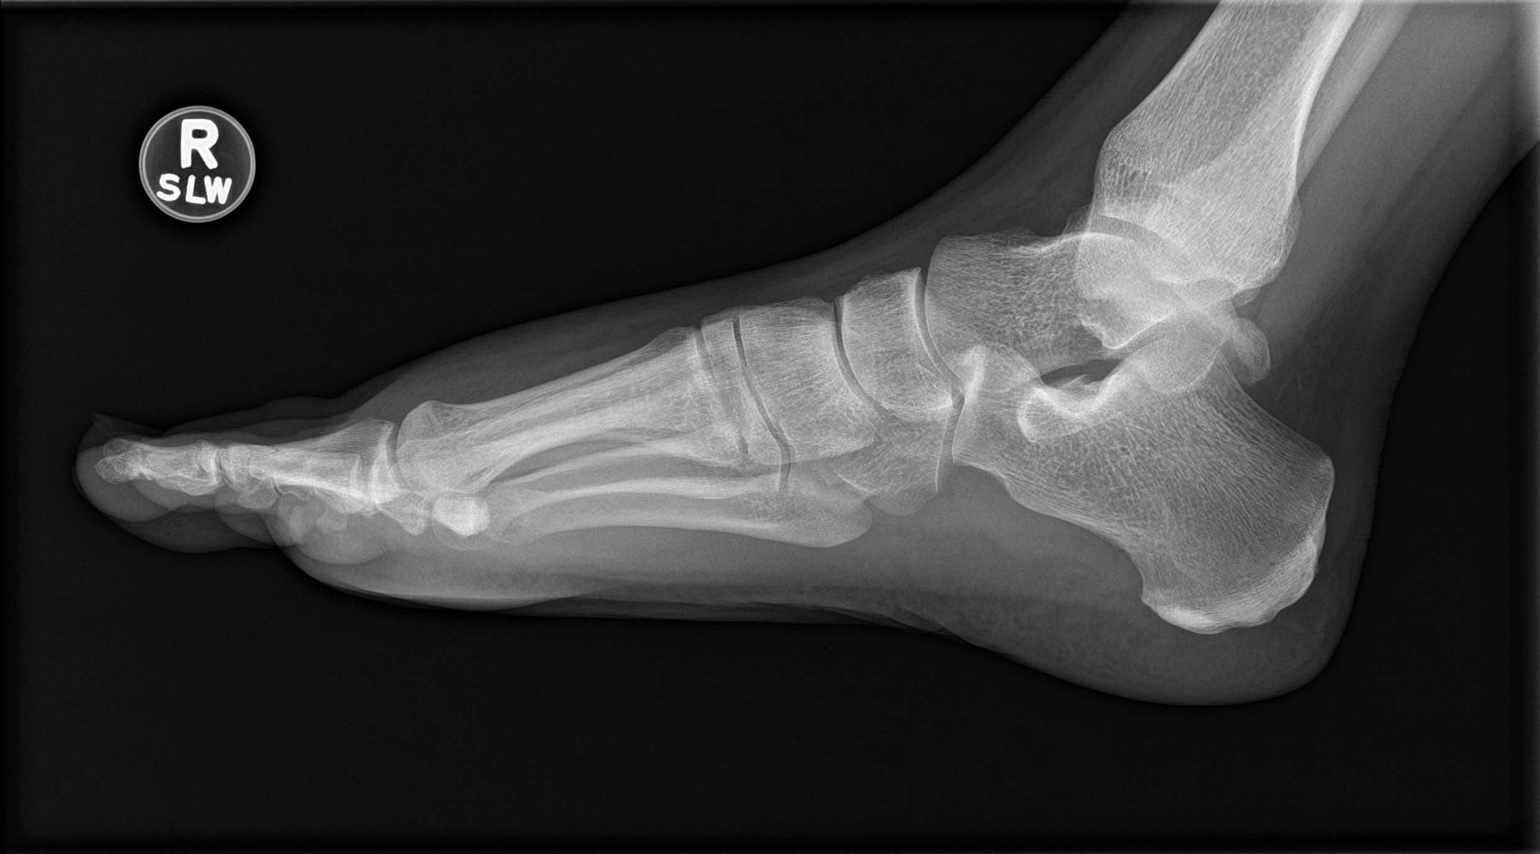

[3 of 3 positions shown; findings below may reference images not displayed]

FINDINGS: There is a comminuted intraarticular fracture in the distal aspect
of the right great toe proximal phalanx. Minimal displacement of
fracture fragments. Joint spaces are maintained.
IMPRESSION: Comminuted, intra-articular fracture through the distal aspect of
the right great toe proximal phalanx.

## 2016-11-13 ENCOUNTER — Encounter: Payer: Self-pay | Admitting: Physician Assistant

## 2016-11-13 DIAGNOSIS — L301 Dyshidrosis [pompholyx]: Secondary | ICD-10-CM

## 2016-11-13 HISTORY — DX: Dyshidrosis (pompholyx): L30.1

## 2016-11-20 DIAGNOSIS — L309 Dermatitis, unspecified: Secondary | ICD-10-CM | POA: Diagnosis not present

## 2017-02-19 ENCOUNTER — Encounter: Payer: Self-pay | Admitting: Physician Assistant

## 2017-02-19 ENCOUNTER — Ambulatory Visit (INDEPENDENT_AMBULATORY_CARE_PROVIDER_SITE_OTHER): Payer: BLUE CROSS/BLUE SHIELD | Admitting: Physician Assistant

## 2017-02-19 VITALS — BP 142/95 | HR 75 | Ht 72.0 in | Wt 194.7 lb

## 2017-02-19 DIAGNOSIS — Z823 Family history of stroke: Secondary | ICD-10-CM | POA: Insufficient documentation

## 2017-02-19 DIAGNOSIS — T7808XA Anaphylactic reaction due to eggs, initial encounter: Secondary | ICD-10-CM | POA: Insufficient documentation

## 2017-02-19 DIAGNOSIS — N069 Isolated proteinuria with unspecified morphologic lesion: Secondary | ICD-10-CM | POA: Diagnosis not present

## 2017-02-19 DIAGNOSIS — R800 Isolated proteinuria: Secondary | ICD-10-CM

## 2017-02-19 DIAGNOSIS — Z13220 Encounter for screening for lipoid disorders: Secondary | ICD-10-CM | POA: Diagnosis not present

## 2017-02-19 DIAGNOSIS — I10 Essential (primary) hypertension: Secondary | ICD-10-CM

## 2017-02-19 DIAGNOSIS — Z136 Encounter for screening for cardiovascular disorders: Secondary | ICD-10-CM

## 2017-02-19 HISTORY — DX: Anaphylactic reaction due to eggs, initial encounter: T78.08XA

## 2017-02-19 NOTE — Patient Instructions (Addendum)
For your blood pressure: - BP goal <= 130/80 - Check blood pressure at home - Limit salt to <2000 mg/day - Follow DASH eating plan - limit alcohol to 2 standard drinks per day - avoid tobacco products - weight loss: 7% of current body weight   Proteinuria Proteinuria is when there is too much protein in the urine. Proteins are important for buildingmuscles and bones. Proteins are also needed to fight infections, help the blood clot, and keep body fluids in balance. Proteinuria may be mild and temporary, or it may be an early sign of kidney disease. The kidneys make urine. Healthy kidneys also keep substances like proteins from leaving the blood and ending up in the urine. Proteinuria may be a sign that the kidneys are not working well. What are the causes? Healthy kidneys have filters (glomeruli) that keep proteins out of the urine. Proteinuria may mean that the glomeruli are damaged. The main causes of this type of damage are:  Diabetes.  High blood pressure.  Other causes of kidney damage can also cause proteinuria, such as:  Diseases of the immune system, such as lupus, rheumatoid arthritis, sarcoidosis, and Goodpasture syndrome.  Heart disease or heart failure.  Kidney infection.  Certain cancers, including kidney cancer, lymphoma, leukemia, and multiple myeloma.  Amyloidosis. This is a disease that causes abnormal proteins to build up in body tissues.  Reactions to certain medicines, such as NSAIDs.  Injury (trauma) or poisons (toxins).  High blood pressure that occurs during pregnancy (preeclampsia).  Temporary proteinuria may result from conditions that put stress on the kidneys. These conditions usually do not cause kidney damage. They include:  Fever.  Exposure to cold or heat.  Emotional or physical stress.  Extreme exercise.  Standing for long periods of time.  What increases the risk? This condition is more likely to develop in people who:  Have  diabetes.  Have high blood pressure.  Have heart disease or heart failure.  Have an immune disease, cancer, or other disease that affects the kidneys.  Have a family history of kidney disease.  Are 65 or older.  Are overweight.  Are of African-American, American [place], Hispanic/Latino, or [place] descent.  Are pregnant.  Have an infection.  What are the signs or symptoms? Mild proteinuria may not cause symptoms. As more proteins enter the urine, symptoms of kidney disease may develop, such as:  Foamy urine.  Swelling of the face, abdomen, hands, legs, or feet (edema).  Needing to urinate frequently.  Fatigue.  Difficulty sleeping.  Dry and itchy skin.  Nausea and vomiting.  Muscle cramps.  Shortness of breath.  How is this diagnosed? Your health care provider can diagnose proteinuria with a urine test. You may have this test as part of a routine physical or because you have symptoms of kidney disease or risk factors for kidney disease. You may also have:  Blood tests to measure the level of a certain substance (creatinine) that increases with kidney disease.  Imaging tests of your kidney, such as a CT scan or an ultrasound, to look for signs of kidney damage.  How is this treated? If your proteinuria is mild or temporary, no treatment may be needed. Your health care provider may show you how to monitor the level of protein in your urine at home. Identifying proteinuria early is important so that the cause of the condition can be treated. Treatment depends on the cause of your proteinuria. Treatment may include:  Making diet and lifestyle  changes.  Getting blood pressure under control.  Getting blood sugar under control, if you have diabetes.  Managing any other medical conditions you have that affect your kidneys.  Giving birth, if you are pregnant.  Avoiding medicines that damage your kidneys.  Treating kidney disease with medicine and  dialysis, as needed.  Follow these instructions at home:  Check your protein levels at home, if directed by your health care provider.  Follow instructions from your health care provider about eating or drinking restrictions.  Take over-the-counter and prescription medicines only as told by your health care provider.  Return to your normal activities as told by your health care provider. Ask your health care provider what activities are safe for you.  If you are overweight, ask your health care provider to help you with a diet to get to a healthy weight.  Ask your health care provider to help you with an exercise program.  Keep all follow-up visits as told by your health care provider. This is important. Contact a health care provider if:  You have new symptoms.  Your symptoms get worse or do not improve. Get help right away if:  You have back pain.  You have diarrhea.  You vomit.  You have a fever.  You have a rash. This information is not intended to replace advice given to you by your health care provider. Make sure you discuss any questions you have with your health care provider. Document Released: 03/22/2005 Document Revised: 03/12/2015 Document Reviewed: 12/21/2014 Elsevier Interactive Patient Education  [employer].

## 2017-02-19 NOTE — Progress Notes (Signed)
HPI:                                                                Ricardo Young is a 32 y.o. male who presents to [facility]: Primary [facility] Medicine today for proteinuria   Reports incidental finding of proteinuria at [employer] physical 1 week ago. Denies urinary symptoms. Reports BP was also elevated 142/94. Reports he has very intermittently checked his BP and reports readings in the 110-120/80's. Family history of HTN in both parents, father stroke age 81 Denies family history of kidney disease  Past Medical History:  Diagnosis Date  . Allergy   . 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 Asthma, chronic 03/26/2014  . Dyshidrotic eczema 11/13/2016   Past Surgical History:  Procedure Laterality Date  . MOLE REMOVAL    . WISDOM TOOTH EXTRACTION     Social History   Tobacco Use  . Smoking status: Never Smoker  . Smokeless tobacco: Never Used  Substance Use Topics  . Alcohol use: No    Alcohol/week: 0.0 oz   family history includes Diabetes in his maternal grandfather and maternal grandmother; Hypertension in his father and mother; Stroke in his father, maternal grandfather, and maternal grandmother.  ROS: Review of Systems  Cardiovascular: Negative.   Genitourinary: Negative.      Medications: Current Outpatient Medications  Medication Sig Dispense Refill  . hydrocortisone 1 % ointment Apply 1 application topically 2 (two) times daily.     No current facility-administered medications for this visit.    Allergies  Allergen Reactions  . Augmentin [Amoxicillin-Pot Clavulanate] Anaphylaxis  . Eggs Or Egg-Derived Products Anaphylaxis  . Influenza Vaccines Anaphylaxis  . Amoxicillin        Objective:  BP (!) 142/95   Pulse 75   Ht 6' (1.829 m)   Wt 194 lb 11.2 oz (88.3 kg)   BMI 26.41 kg/m  Gen:  alert, not ill-appearing, no distress, appropriate for age HEENT: head normocephalic without obvious abnormality, conjunctiva and cornea clear,  trachea midline Pulm: Normal work of breathing, normal phonation, clear to auscultation bilaterally, no wheezes, rales or rhonchi CV: Normal rate, regular rhythm, s1 and s2 distinct, no murmurs, clicks or rubs  Neuro: alert and oriented x 3, no tremor MSK: extremities atraumatic, normal gait and station, no peripheral edema Skin: intact, no rashes on exposed skin, no jaundice, no cyanosis Psych: well-groomed, cooperative, good eye contact, euthymic mood, affect mood-congruent, speech is articulate, and thought processes clear and goal-directed  No results found for this or any previous visit (from the past 72 hour(s)). No results found.    Assessment and Plan: 32 y.o. male with   1. Isolated proteinuria without specific morphologic lesion - plan to monitor BP at home and Q7months in office. Plan to monitor UA Q72months - Urinalysis, Routine w reflex microscopic - COMPLETE METABOLIC PANEL WITH GFR  2. Encounter for lipid screening for cardiovascular disease - Lipid Panel w/reflex Direct LDL  3. Essential hypertension BP Readings from Last 3 Encounters:  02/19/17 (!) 142/95  11/10/16 135/87  05/28/16 113/76  - no additional CVD risk factors - therapeutic lifestyle changes - monitor BP at home and log readings - follow-up in 6 months   Patient education and anticipatory guidance  given Patient agrees with treatment plan Follow-up in 6 months or sooner as needed if symptoms worsen or fail to improve  Levonne Hubertharley E. Cummings PA-C

## 2017-02-21 DIAGNOSIS — Z13220 Encounter for screening for lipoid disorders: Secondary | ICD-10-CM | POA: Diagnosis not present

## 2017-02-21 DIAGNOSIS — Z136 Encounter for screening for cardiovascular disorders: Secondary | ICD-10-CM | POA: Diagnosis not present

## 2017-02-21 DIAGNOSIS — N069 Isolated proteinuria with unspecified morphologic lesion: Secondary | ICD-10-CM | POA: Diagnosis not present

## 2017-02-21 LAB — COMPLETE METABOLIC PANEL WITH GFR
AG Ratio: 1.9 (calc) (ref 1.0–2.5)
ALKALINE PHOSPHATASE (APISO): 67 U/L (ref 40–115)
ALT: 41 U/L (ref 9–46)
AST: 21 U/L (ref 10–40)
Albumin: 4.5 g/dL (ref 3.6–5.1)
BUN: 14 mg/dL (ref 7–25)
CALCIUM: 9.1 mg/dL (ref 8.6–10.3)
CO2: 29 mmol/L (ref 20–32)
CREATININE: 0.95 mg/dL (ref 0.60–1.35)
Chloride: 104 mmol/L (ref 98–110)
GFR, EST NON AFRICAN AMERICAN: 106 mL/min/{1.73_m2} (ref >=60)
GFR, Est African American: 123 mL/min/{1.73_m2} (ref >=60)
GLUCOSE: 95 mg/dL (ref 65–99)
Globulin: 2.4 g/dL (calc) (ref 1.9–3.7)
Potassium: 4.4 mmol/L (ref 3.5–5.3)
SODIUM: 140 mmol/L (ref 135–146)
TOTAL PROTEIN: 6.9 g/dL (ref 6.1–8.1)
Total Bilirubin: 0.6 mg/dL (ref 0.2–1.2)

## 2017-02-21 LAB — LIPID PANEL W/REFLEX DIRECT LDL
CHOL/HDL RATIO: 4.1 (calc) (ref <5.0)
CHOLESTEROL: 179 mg/dL (ref <200)
HDL: 44 mg/dL (ref >40)
LDL CHOLESTEROL (CALC): 118 mg/dL — AB
NON-HDL CHOLESTEROL (CALC): 135 mg/dL — AB (ref <130)
Triglycerides: 75 mg/dL (ref <150)

## 2017-02-23 NOTE — Progress Notes (Signed)
Your labs look good - normal kidney function, liver enzymes - cholesterol in a healthy range - normal blood counts - no evidence of diabetes

## 2018-08-20 DIAGNOSIS — S0501XA Injury of conjunctiva and corneal abrasion without foreign body, right eye, initial encounter: Secondary | ICD-10-CM | POA: Diagnosis not present

## 2018-08-21 DIAGNOSIS — S0501XD Injury of conjunctiva and corneal abrasion without foreign body, right eye, subsequent encounter: Secondary | ICD-10-CM | POA: Diagnosis not present

## 2020-01-03 ENCOUNTER — Ambulatory Visit: Payer: Self-pay

## 2020-01-05 ENCOUNTER — Emergency Department (INDEPENDENT_AMBULATORY_CARE_PROVIDER_SITE_OTHER)
Admission: EM | Admit: 2020-01-05 | Discharge: 2020-01-05 | Disposition: A | Discharge status: Home / Self Care | Payer: PRIVATE HEALTH INSURANCE | Source: Home / Self Care

## 2020-01-05 ENCOUNTER — Other Ambulatory Visit: Payer: Self-pay

## 2020-01-05 DIAGNOSIS — Z760 Encounter for issue of repeat prescription: Secondary | ICD-10-CM

## 2020-01-05 DIAGNOSIS — J069 Acute upper respiratory infection, unspecified: Secondary | ICD-10-CM

## 2020-01-05 MED ORDER — EPINEPHRINE 0.3 MG/0.3ML IJ SOAJ: 0.3000 mg | INTRAMUSCULAR | 0 refills | Status: AC | PRN

## 2020-01-05 MED ORDER — PREDNISONE 20 MG PO TABS: 40.0000 mg | ORAL_TABLET | Freq: Every day | ORAL | 0 refills | Status: DC

## 2020-01-05 MED ORDER — BENZONATATE 100 MG PO CAPS: 100.0000 mg | ORAL_CAPSULE | Freq: Three times a day (TID) | ORAL | 0 refills | Status: DC

## 2020-01-05 MED ORDER — DOXYCYCLINE HYCLATE 100 MG PO CAPS: 100.0000 mg | ORAL_CAPSULE | Freq: Two times a day (BID) | ORAL | 0 refills | Status: AC

## 2020-01-05 NOTE — ED Triage Notes (Signed)
Pt c/o cold sxs since last Sunday. Started as intermittent fever. Wife was sick as well. All her tests neg including covid PCR and rapid. Non productive Cough started Thurs. Fever of 101 2 days ago, and now sore throat from coughing so much. Nyquil prn. Hx of allergies, benedryl prn. [facility] PCR covid test pending for himself.

## 2020-01-05 NOTE — ED Provider Notes (Signed)
[facility]    CSN: [id number] Arrival date & time: 01/05/20  1319      History   Chief Complaint Chief Complaint  Patient presents with  . Cough  . Sore Throat    HPI Ricardo Young is a 34 y.o. male.   HPI Patient presents today with over a week and a half of symptoms including cough, congestion, body aches, sore throat.  He has had 2 sick contacts in the household however both have had negative Covid test and their symptoms have resolved.  He has had a progressively cough which is now resulted in some chest tightness.  Cough is nonproductive and cyclic.  Patient has a distant history of asthma as an adolescent however has not had any recent flares.  Patient currently has a Cov[facility] test pending [facility].  He is afebrile. Past Medical History:  Diagnosis Date  . Allergy   . 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 Asthma, chronic 03/26/2014  . Dyshidrotic eczema 11/13/2016    Patient Active Problem List   Diagnosis Date Noted  . Family history of stroke or transient ischemic attack in father 02/19/2017  . 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 Essential hypertension 02/19/2017  . Isolated proteinuria without specific morphologic lesion 02/19/2017  . Dyshidrotic eczema 11/13/2016  . Eczema 04/14/2015  . Hypertriglyceridemia 05/25/2014  . Asthma, chronic 03/26/2014    Past Surgical History:  Procedure Laterality Date  . MOLE REMOVAL    . WISDOM TOOTH EXTRACTION         Home Medications    Prior to Admission medications   Medication Sig Start Date End Date Taking? Authorizing Provider  hydrocortisone 1 % ointment Apply 1 application topically 2 (two) times daily.    [provider]    Family History Family History  Problem Relation Age of Onset  . Stroke Maternal Grandmother   . Diabetes Maternal Grandmother   . Stroke Maternal Grandfather   . Diabetes Maternal Grandfather   . Hypertension Mother   . Hypertension Father   . Stroke Father      Social History Social History   Tobacco Use  . Smoking status: Never Smoker  . Smokeless tobacco: Never Used  Vaping Use  . Vaping Use: Never used  Substance Use Topics  . Alcohol use: No    Alcohol/week: 0.0 standard drinks  . Drug use: No     Allergies   Augmentin [amoxicillin-pot clavulanate], Eggs or egg-derived products, Influenza vaccines, and Amoxicillin   Review of Systems Review of Systems Pertinent negatives listed in HPI  Physical Exam Triage Vital Signs ED Triage Vitals  Enc Vitals Group     BP 01/05/20 1350 134/81     Pulse Rate 01/05/20 1350 (!) 110     Resp 01/05/20 1350 18     Temp 01/05/20 1350 98.4 F (36.9 C)     Temp src --      SpO2 01/05/20 1350 96 %     Weight --      Height --      Head Circumference --      Peak Flow --      Pain Score 01/05/20 1353 3     Pain Loc --      Pain Edu? --      Excl. in GC? --    No data found.  Updated Vital Signs BP 134/81 (BP Location: Left Arm)   Pulse (!) 110   Temp 98.4 F (36.9 C)  Resp 18   SpO2 96%   Visual Acuity Right Eye Distance:   Left Eye Distance:   Bilateral Distance:    Right Eye Near:   Left Eye Near:    Bilateral Near:     Physical Exam General appearance: alert, well developed, well nourished, cooperative  Head: Normocephalic, without obvious abnormality, atraumatic Respiratory: Coarse lung sounds, negative for wheezing, mild rhonchi, no crackles or rales Heart: rate and rhythm normal. No gallop or murmurs noted on exam  Abdomen: BS +, no distention, no rebound tenderness, or no mass Extremities: No gross deformities Skin: Skin color, texture, turgor normal. No rashes seen  Psych: Appropriate mood and affect. UC Treatments / Results  Labs (all labs ordered are listed, but only abnormal results are displayed) Labs Reviewed - No data to display  EKG   Radiology No results found.  Procedures Procedures (including critical care time)  Medications Ordered  in UC Medications - No data to display  Initial Impression / Assessment and Plan / UC Course  I have reviewed the triage vital signs and the nursing notes.  Pertinent labs & imaging results that were available during my care of the patient were reviewed by me and considered in my medical decision making (see chart for details).    Refill patient's EpiPen per his request.  Covering for acute bronchitis with doxycycline, prednisone and Tessalon Perles as needed for cough. If symptoms worsen or do not improve follow-up with primary care provider.   Final Clinical Impressions(s) / UC Diagnoses   Final diagnoses:  Viral upper respiratory tract infection  Medication refill   Discharge Instructions   None    ED Prescriptions    Medication Sig Dispense Auth. Provider   EPINEPHrine 0.3 mg/0.3 mL IJ SOAJ injection Inject 0.3 mg into the muscle as needed for anaphylaxis. 1 each Bing Neighbors, FNP   doxycycline (VIBRAMYCIN) 100 MG capsule Take 1 capsule (100 mg total) by mouth 2 (two) times daily for 7 days. 14 capsule Bing Neighbors, FNP   benzonatate (TESSALON) 100 MG capsule Take 1 capsule (100 mg total) by mouth every 8 (eight) hours. 21 capsule Bing Neighbors, FNP   predniSONE (DELTASONE) 20 MG tablet Take 2 tablets (40 mg total) by mouth daily with breakfast. 10 tablet Bing Neighbors, FNP     PDMP not reviewed this encounter.   Bing Neighbors, FNP 01/05/20 1906

## 2020-01-20 ENCOUNTER — Other Ambulatory Visit: Payer: Self-pay

## 2020-01-20 ENCOUNTER — Ambulatory Visit (INDEPENDENT_AMBULATORY_CARE_PROVIDER_SITE_OTHER): Payer: BC Managed Care – PPO | Admitting: Family Medicine

## 2020-01-20 ENCOUNTER — Encounter: Payer: Self-pay | Admitting: Family Medicine

## 2020-01-20 VITALS — BP 134/87 | HR 98 | Temp 98.7°F | Wt 185.0 lb

## 2020-01-20 DIAGNOSIS — Z20822 Contact with and (suspected) exposure to covid-19: Secondary | ICD-10-CM | POA: Diagnosis not present

## 2020-01-20 DIAGNOSIS — J189 Pneumonia, unspecified organism: Secondary | ICD-10-CM

## 2020-01-20 DIAGNOSIS — R6889 Other general symptoms and signs: Secondary | ICD-10-CM

## 2020-01-20 LAB — POCT INFLUENZA A/B
Influenza A, POC: NEGATIVE
Influenza B, POC: NEGATIVE

## 2020-01-20 MED ORDER — AZITHROMYCIN 250 MG PO TABS: ORAL_TABLET | ORAL | 0 refills | Status: DC

## 2020-01-20 NOTE — Assessment & Plan Note (Signed)
Influenza negative.  Will repeat COVID testing with development of new symptoms this week. Will cover for atypical pneumonia with azithromycin.  Will plan to get CXR if symptoms are not improving.  May need to broaden antibiotics as well if not improving.

## 2020-01-20 NOTE — Patient Instructions (Signed)
Start azithromycin, take as directed.  Increase fluid intake. Try delsym for cough.  Contact clinic if symptoms continue to worsen or if symptoms are not improving over the next week.  I would remain out of work for at least the next 48 hours.

## 2020-01-20 NOTE — Progress Notes (Signed)
Ricardo Young - 34 y.o. male MRN [id number]  Date of birth: 04/02/1985  Subjective Chief Complaint  Patient presents with  . flu symptoms    HPI Ricardo Young is a 34 y.o. male here today with complaint of cough, chest congestion and fever.  He reports that initial onset of symptoms was about 3-4 weeks ago.  He was seen on 11/22 at urgent care and and started on doxycycline, prednisone and tessalon perles. He had negative COVID testing.  His symptoms continued for a couple of weeks and then improved last week.  His cough never resolved.  He reports that this week his cough worsened again and he is coughing up more mucus.  He also developed fever up to 102 yesterday.  He does feel fatigued as well.  He denies body aches, nausea, vomiting, diarrhea, headache, shortness of breath.    ROS:  A comprehensive ROS was completed and negative except as noted per HPI  Allergies  Allergen Reactions  . Augmentin [Amoxicillin-Pot Clavulanate] Anaphylaxis  . Eggs Or Egg-Derived Products Anaphylaxis  . Influenza Vaccines Anaphylaxis  . Amoxicillin     Past Medical History:  Diagnosis Date  . Allergy   . 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 Asthma, chronic 03/26/2014  . Dyshidrotic eczema 11/13/2016    Past Surgical History:  Procedure Laterality Date  . MOLE REMOVAL    . WISDOM TOOTH EXTRACTION      Social History   Socioeconomic History  . Marital status: Married    Spouse name: Not on file  . Number of children: Not on file  . Years of education: Not on file  . Highest education level: Not on file  Occupational History  . Not on file  Tobacco Use  . Smoking status: Never Smoker  . Smokeless tobacco: Never Used  Vaping Use  . Vaping Use: Never used  Substance and Sexual Activity  . Alcohol use: No    Alcohol/week: 0.0 standard drinks  . Drug use: No  . Sexual activity: Yes    Partners: Female  Other Topics Concern  . Not on file  Social History Narrative  . Not on file    Social Determinants of Health   Financial Resource Strain:   . Difficulty of Paying Living Expenses: Not on file  Food Insecurity:   . Worried About Programme researcher, broadcasting/film/video in the Last Year: Not on file  . Ran Out of Food in the Last Year: Not on file  Transportation Needs:   . Lack of Transportation (Medical): Not on file  . Lack of Transportation (Non-Medical): Not on file  Physical Activity:   . Days of Exercise per Week: Not on file  . Minutes of Exercise per Session: Not on file  Stress:   . Feeling of Stress : Not on file  Social Connections:   . Frequency of Communication with Friends and Family: Not on file  . Frequency of Social Gatherings with Friends and Family: Not on file  . Attends Religious Services: Not on file  . Active Member of Clubs or Organizations: Not on file  . Attends Banker Meetings: Not on file  . Marital Status: Not on file    Family History  Problem Relation Age of Onset  . Stroke Maternal Grandmother   . Diabetes Maternal Grandmother   . Stroke Maternal Grandfather   . Diabetes Maternal Grandfather   . Hypertension Mother   . Hypertension Father   . Stroke Father  Health Maintenance  Topic Date Due  . Hepatitis C Screening  Never done  . COVID-19 Vaccine (1) Never done  . HIV Screening  Never done  . TETANUS/TDAP  01/14/2007     ----------------------------------------------------------------------------------------------------------------------------------------------------------------------------------------------------------------- Physical Exam BP 134/87 (BP Location: Left Arm, Patient Position: Sitting, Cuff Size: Normal)   Pulse 98   Temp 98.7 F (37.1 C) (Oral)   Wt 185 lb (83.9 kg)   SpO2 96%   BMI 25.09 kg/m   Physical Exam Constitutional:      Appearance: Normal appearance.  HENT:     Head: Normocephalic and atraumatic.  Cardiovascular:     Rate and Rhythm: Normal rate and regular rhythm.   Pulmonary:     Breath sounds: No wheezing.     Comments: Slightly diminished breath sounds RML. Skin:    General: Skin is warm and dry.  Neurological:     General: No focal deficit present.     Mental Status: He is alert.  Psychiatric:        Mood and Affect: Mood normal.        Behavior: Behavior normal.     ------------------------------------------------------------------------------------------------------------------------------------------------------------------------------------------------------------------- Assessment and Plan  Atypical pneumonia Influenza negative.  Will repeat COVID testing with development of new symptoms this week. Will cover for atypical pneumonia with azithromycin.  Will plan to get CXR if symptoms are not improving.  May need to broaden antibiotics as well if not improving.     Meds ordered this encounter  Medications  . azithromycin (ZITHROMAX) 250 MG tablet    Sig: Take 2 tablets on day 1 followed by 1 tablet on days 2-5    Dispense:  6 tablet    Refill:  0    No follow-ups on file.    This visit occurred during the SARS-CoV-2 public health emergency.  Safety protocols were in place, including screening questions prior to the visit, additional usage of staff PPE, and extensive cleaning of exam room while observing appropriate contact time as indicated for disinfecting solutions.

## 2020-01-22 LAB — SARS-COV-2, NAA 2 DAY TAT

## 2020-01-22 LAB — NOVEL CORONAVIRUS, NAA: SARS-CoV-2, NAA: NOT DETECTED

## 2020-08-04 ENCOUNTER — Encounter: Payer: Self-pay | Admitting: Family Medicine

## 2022-09-11 ENCOUNTER — Encounter: Payer: Self-pay | Admitting: Family Medicine

## 2022-09-11 ENCOUNTER — Ambulatory Visit: Payer: BC Managed Care – PPO | Admitting: Family Medicine

## 2022-09-11 VITALS — BP 147/102 | HR 73 | Resp 20 | Ht 72.0 in | Wt 201.2 lb

## 2022-09-11 DIAGNOSIS — L309 Dermatitis, unspecified: Secondary | ICD-10-CM | POA: Diagnosis not present

## 2022-09-11 DIAGNOSIS — J302 Other seasonal allergic rhinitis: Secondary | ICD-10-CM

## 2022-09-11 DIAGNOSIS — R030 Elevated blood-pressure reading, without diagnosis of hypertension: Secondary | ICD-10-CM | POA: Diagnosis not present

## 2022-09-11 DIAGNOSIS — L03012 Cellulitis of left finger: Secondary | ICD-10-CM

## 2022-09-11 MED ORDER — DOXYCYCLINE HYCLATE 100 MG PO TABS
100.0000 mg | ORAL_TABLET | Freq: Two times a day (BID) | ORAL | 0 refills | Status: AC
Start: 2022-09-11 — End: 2022-09-18

## 2022-09-11 MED ORDER — MUPIROCIN 2 % EX OINT: TOPICAL_OINTMENT | CUTANEOUS | 3 refills | Status: AC

## 2022-09-11 MED ORDER — TRIAMCINOLONE ACETONIDE 0.5 % EX CREA
1.0000 | TOPICAL_CREAM | Freq: Two times a day (BID) | CUTANEOUS | 3 refills | Status: AC
Start: 2022-09-11 — End: ?

## 2022-09-11 MED ORDER — FLUTICASONE PROPIONATE 50 MCG/ACT NA SUSP
2.0000 | Freq: Every day | NASAL | 6 refills | Status: AC
Start: 2022-09-11 — End: ?

## 2022-09-11 NOTE — Assessment & Plan Note (Signed)
-   On physical exam nares is dry which I believe is probably the cause of his nosebleeds and irritation.  Recommended using Flonase or a nasal saline spray to keep the area moist - Recommended against Benadryl and told patient to try Xyzal

## 2022-09-11 NOTE — Assessment & Plan Note (Signed)
Patient has elevated blood pressure and on repeat it is still elevated.  Will have patient follow-up in 2 to 4 weeks for blood pressure evaluation it does look upon chart review he does have a history of hypertension it is unclear if he is not taking medication

## 2022-09-11 NOTE — Assessment & Plan Note (Signed)
Patient presents for cellulitis of the pinky of the left hand.  Will go ahead and treat with doxycycline at this time to cover for staph.  Follow-up in 2 weeks

## 2022-09-11 NOTE — Progress Notes (Signed)
Acute Office Visit  Subjective:     Patient ID: Ricardo Young, male    DOB: 11-11-1985, 37 y.o.   MRN: [id number]  Chief Complaint  Patient presents with   Transitions Of Care    Left 5th digit infection.     HPI Patient is in today for concerns of eczema on his fifth digit on left hand.  He has a history of eczema and is usually followed by dermatology however dermatology told him he would not be able to be seen until September.  He has been trying to treat this himself at home with proper handwashing and some peroxide as well as some Neosporin, but has not noticed any of it getting better.  Over the past month he has also noticed extreme dryness in his nasal passages to where he has been having more frequent nosebleeds.  He has not tried anything for this.  He does have seasonal allergies for which she uses Benadryl as needed.  He says he has tried other allergy medications in the past but felt like they never worked for him.  Review of Systems  Constitutional:  Negative for chills and fever.  Respiratory:  Negative for cough and shortness of breath.   Cardiovascular:  Negative for chest pain.  Neurological:  Negative for headaches.        Objective:    BP (!) 147/102 (BP Location: Right Arm, Patient Position: Sitting, Cuff Size: Normal)   Pulse 73   Resp 20   Ht 6' (1.829 m)   Wt 201 lb 4 oz (91.3 kg)   SpO2 100%   BMI 27.29 kg/m    Physical Exam Vitals and nursing note reviewed.  Constitutional:      General: He is not in acute distress.    Appearance: Normal appearance.  HENT:     Head: Normocephalic and atraumatic.     Right Ear: External ear normal.     Left Ear: External ear normal.     Nose: Nose normal.  Eyes:     Conjunctiva/sclera: Conjunctivae normal.  Cardiovascular:     Rate and Rhythm: Normal rate and regular rhythm.  Pulmonary:     Effort: Pulmonary effort is normal.     Breath sounds: Normal breath sounds.  Skin:    Comments: Skin  breakdown of the fifth digit on left hand with white purulent discharge under the skin. Nontender to touch  Neurological:     General: No focal deficit present.     Mental Status: He is alert and oriented to person, place, and time.  Psychiatric:        Mood and Affect: Mood normal.        Behavior: Behavior normal.        Thought Content: Thought content normal.        Judgment: Judgment normal.     No results found for any visits on 09/11/22.      Assessment & Plan:   Problem List Items Addressed This Visit       Respiratory   Seasonal allergic rhinitis    - On physical exam nares is dry which I believe is probably the cause of his nosebleeds and irritation.  Recommended using Flonase or a nasal saline spray to keep the area moist - Recommended against Benadryl and told patient to try Xyzal      Relevant Medications   fluticasone (FLONASE) 50 MCG/ACT nasal spray     Musculoskeletal and Integument   Eczema  Very pleasant 37 year old male presents with history of eczema.  Will go ahead and him on triamcinolone cream for maintenance once his cellulitis clears up.  Recommended topical emollients such as Aveeno or Aquaphor      Relevant Medications   triamcinolone cream (KENALOG) 0.5 %     Other   Cellulitis of finger of left hand - Primary    Patient presents for cellulitis of the pinky of the left hand.  Will go ahead and treat with doxycycline at this time to cover for staph.  Follow-up in 2 weeks      Relevant Medications   doxycycline (VIBRA-TABS) 100 MG tablet   Elevated blood pressure reading    Patient has elevated blood pressure and on repeat it is still elevated.  Will have patient follow-up in 2 to 4 weeks for blood pressure evaluation it does look upon chart review he does have a history of hypertension it is unclear if he is not taking medication       Meds ordered this encounter  Medications   triamcinolone cream (KENALOG) 0.5 %    Sig: Apply 1  Application topically 2 (two) times daily. To affected areas.    Dispense:  30 g    Refill:  3   doxycycline (VIBRA-TABS) 100 MG tablet    Sig: Take 1 tablet (100 mg total) by mouth 2 (two) times daily for 7 days.    Dispense:  14 tablet    Refill:  0   fluticasone (FLONASE) 50 MCG/ACT nasal spray    Sig: Place 2 sprays into both nostrils daily.    Dispense:  16 g    Refill:  6   mupirocin ointment (BACTROBAN) 2 %    Sig: Apply to affected area TID for 7 days.    Dispense:  44 each    Refill:  3    Return in about 4 weeks (around 10/09/2022).  Charlton Amor, DO

## 2022-09-11 NOTE — Assessment & Plan Note (Signed)
Very pleasant 37 year old male presents with history of eczema.  Will go ahead and him on triamcinolone cream for maintenance once his cellulitis clears up.  Recommended topical emollients such as Aveeno or Aquaphor

## 2022-09-13 ENCOUNTER — Telehealth: Payer: Self-pay | Admitting: Family Medicine

## 2022-09-13 NOTE — Telephone Encounter (Signed)
Probably not allergic reaction based on symptoms.  This medication can cause nausea which can be relieved by taking with food.  I can call something in for nausea as I would recommend this medication for the infection that he has, especially with his other medication allergies.   CM

## 2022-09-13 NOTE — Telephone Encounter (Signed)
Patient called in stating that the doxycycline is making him throw up. Wanting to know if he can get a another medication or continue to take. He fears he may be allergic to the medication. Please Advise.  [facility]  [employer] [phone]

## 2022-09-14 NOTE — Telephone Encounter (Signed)
Spoke with pt and he states the med bottle says to take on an empty stomach, and he has tried both with food and without and was vomiting none stop. Does not want you to call him anything for nausea and will try something at home first. Plz advise thank you Roselyn Reef, CMA

## 2022-09-14 NOTE — Telephone Encounter (Signed)
Left detailed message per the pt letting him know of Dr. Ashley Royalty recommendation and to give the office a call back to let me know what he would like to do. Roselyn Reef, CMA

## 2022-10-12 ENCOUNTER — Ambulatory Visit (INDEPENDENT_AMBULATORY_CARE_PROVIDER_SITE_OTHER): Payer: BC Managed Care – PPO | Admitting: Family Medicine

## 2022-10-12 ENCOUNTER — Encounter: Payer: Self-pay | Admitting: Family Medicine

## 2022-10-12 VITALS — BP 147/101 | HR 74 | Ht 72.0 in | Wt 201.8 lb

## 2022-10-12 DIAGNOSIS — L309 Dermatitis, unspecified: Secondary | ICD-10-CM | POA: Diagnosis not present

## 2022-10-12 DIAGNOSIS — I10 Essential (primary) hypertension: Secondary | ICD-10-CM

## 2022-10-12 NOTE — Assessment & Plan Note (Signed)
-   due to high dose steroid cream not working, will send referral to derm. Likely patient needs immunotherapy for better eczema control

## 2022-10-12 NOTE — Assessment & Plan Note (Signed)
-   blood pressure elevated again on todays visit however pt does not want to start htn medicine. Discussed benefits vs risks. He would like to do 3 months of diet and exercise. Did discuss that he needs to be diligent with exercise and diet and lose about 20lbs to help his blood pressure decrease in addition to eating healthy and limiting high salt and soda intake

## 2022-10-12 NOTE — Progress Notes (Signed)
Established patient visit   Patient: Ricardo Young   DOB: 1985-07-13   37 y.o. Male  MRN: [id number] Visit Date: 10/12/2022  Today's healthcare provider: Charlton Amor, DO   Chief Complaint  Patient presents with   Medical Management of Chronic Issues    Left finger infection pt states it got better in the beginning but feels like creme not as effective anymore    SUBJECTIVE    Chief Complaint  Patient presents with   Medical Management of Chronic Issues    Left finger infection pt states it got better in the beginning but feels like creme not as effective anymore   HPI HPI     Medical Management of Chronic Issues    Additional comments: Left finger infection pt states it got better in the beginning but feels like creme not as effective anymore      Last edited by Roselyn Reef, CMA on 10/12/2022  1:36 PM.      Pt presents with continued eczema of the fingers. Says the triamcinolone will help for a few days and then it will come back.  Elevated blood pressure reading - elevated today and last visit - pt says his diet has not been the best   Review of Systems  Constitutional:  Negative for activity change, fatigue and fever.  Respiratory:  Negative for cough and shortness of breath.   Cardiovascular:  Negative for chest pain.  Gastrointestinal:  Negative for abdominal pain.  Genitourinary:  Negative for difficulty urinating.       Current Meds  Medication Sig   EPINEPHrine 0.3 mg/0.3 mL IJ SOAJ injection Inject 0.3 mg into the muscle as needed for anaphylaxis.   fluticasone (FLONASE) 50 MCG/ACT nasal spray Place 2 sprays into both nostrils daily.    OBJECTIVE    BP (!) 147/101 (BP Location: Right Arm, Patient Position: Sitting, Cuff Size: Normal)   Pulse 74   Ht 6' (1.829 m)   Wt 201 lb 12 oz (91.5 kg)   SpO2 100%   BMI 27.36 kg/m   Physical Exam Vitals and nursing note reviewed.  Constitutional:      General: He is not in acute distress.     Appearance: Normal appearance.  HENT:     Head: Normocephalic and atraumatic.     Right Ear: External ear normal.     Left Ear: External ear normal.     Nose: Nose normal.  Eyes:     Conjunctiva/sclera: Conjunctivae normal.  Cardiovascular:     Rate and Rhythm: Normal rate and regular rhythm.  Pulmonary:     Effort: Pulmonary effort is normal.     Breath sounds: Normal breath sounds.  Neurological:     General: No focal deficit present.     Mental Status: He is alert and oriented to person, place, and time.  Psychiatric:        Mood and Affect: Mood normal.        Behavior: Behavior normal.        Thought Content: Thought content normal.        Judgment: Judgment normal.        ASSESSMENT & PLAN    Problem List Items Addressed This Visit       Cardiovascular and Mediastinum   Essential hypertension    - blood pressure elevated again on todays visit however pt does not want to start htn medicine. Discussed benefits vs risks. He would like to do 3 months of  diet and exercise. Did discuss that he needs to be diligent with exercise and diet and lose about 20lbs to help his blood pressure decrease in addition to eating healthy and limiting high salt and soda intake        Musculoskeletal and Integument   Eczema - Primary    - due to high dose steroid cream not working, will send referral to derm. Likely patient needs immunotherapy for better eczema control      Relevant Orders   Ambulatory referral to Dermatology    Return in about 3 months (around 01/12/2023).      No orders of the defined types were placed in this encounter.   Orders Placed This Encounter  Procedures   Ambulatory referral to Dermatology    Referral Priority:   Routine    Referral Type:   Consultation    Referral Reason:   Specialty Services Required    Requested Specialty:   Dermatology    Number of Visits Requested:   1     Charlton Amor, DO  [facility] at  [facility] [phone] (phone) [phone] (fax)  [facility]

## 2023-01-16 ENCOUNTER — Ambulatory Visit: Payer: BC Managed Care – PPO | Admitting: Family Medicine

## 2023-06-21 ENCOUNTER — Encounter: Payer: Self-pay | Admitting: Family Medicine

## 2023-10-01 DIAGNOSIS — D2262 Melanocytic nevi of left upper limb, including shoulder: Secondary | ICD-10-CM | POA: Diagnosis not present

## 2023-10-01 DIAGNOSIS — L309 Dermatitis, unspecified: Secondary | ICD-10-CM | POA: Diagnosis not present

## 2023-10-01 DIAGNOSIS — D485 Neoplasm of uncertain behavior of skin: Secondary | ICD-10-CM | POA: Diagnosis not present

## 2023-10-01 DIAGNOSIS — Z1330 Encounter for screening examination for mental health and behavioral disorders, unspecified: Secondary | ICD-10-CM | POA: Diagnosis not present

## 2023-10-24 ENCOUNTER — Ambulatory Visit: Payer: BC Managed Care – PPO | Admitting: Dermatology

## 2023-10-30 DIAGNOSIS — L905 Scar conditions and fibrosis of skin: Secondary | ICD-10-CM | POA: Diagnosis not present

## 2023-10-30 DIAGNOSIS — D239 Other benign neoplasm of skin, unspecified: Secondary | ICD-10-CM | POA: Diagnosis not present
